# Patient Record
Sex: Male | Born: 1985 | Race: White | Hispanic: No | Marital: Married | Smoking: Current every day smoker
Health system: Southern US, Community
[De-identification: ages and names within clinical notes are randomized; demographics above are authoritative.]

## PROBLEM LIST (undated history)

## (undated) DIAGNOSIS — K219 Gastro-esophageal reflux disease without esophagitis: Secondary | ICD-10-CM

## (undated) DIAGNOSIS — I499 Cardiac arrhythmia, unspecified: Secondary | ICD-10-CM

## (undated) DIAGNOSIS — G4730 Sleep apnea, unspecified: Secondary | ICD-10-CM

---
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
## 2016-09-21 DIAGNOSIS — G4733 Obstructive sleep apnea (adult) (pediatric): Secondary | ICD-10-CM | POA: Diagnosis not present

## 2016-09-22 DIAGNOSIS — G4733 Obstructive sleep apnea (adult) (pediatric): Secondary | ICD-10-CM | POA: Diagnosis not present

## 2016-09-22 DIAGNOSIS — G4719 Other hypersomnia: Secondary | ICD-10-CM | POA: Diagnosis not present

## 2016-09-22 DIAGNOSIS — Z6841 Body Mass Index (BMI) 40.0 and over, adult: Secondary | ICD-10-CM | POA: Diagnosis not present

## 2016-09-22 DIAGNOSIS — Z9989 Dependence on other enabling machines and devices: Secondary | ICD-10-CM | POA: Diagnosis not present

## 2016-10-17 DIAGNOSIS — H5203 Hypermetropia, bilateral: Secondary | ICD-10-CM | POA: Diagnosis not present

## 2017-04-27 ENCOUNTER — Other Ambulatory Visit (HOSPITAL_COMMUNITY): Payer: Self-pay | Admitting: Neurological Surgery

## 2017-04-27 DIAGNOSIS — M5416 Radiculopathy, lumbar region: Secondary | ICD-10-CM

## 2017-05-03 ENCOUNTER — Ambulatory Visit (HOSPITAL_COMMUNITY)
Admission: RE | Admit: 2017-05-03 | Discharge: 2017-05-03 | Disposition: A | Discharge status: Home / Self Care | Payer: Self-pay | Source: Ambulatory Visit | Attending: Neurological Surgery | Admitting: Neurological Surgery

## 2017-05-03 DIAGNOSIS — M5416 Radiculopathy, lumbar region: Secondary | ICD-10-CM | POA: Insufficient documentation

## 2017-05-03 DIAGNOSIS — M48061 Spinal stenosis, lumbar region without neurogenic claudication: Secondary | ICD-10-CM | POA: Insufficient documentation

## 2017-05-03 DIAGNOSIS — M4807 Spinal stenosis, lumbosacral region: Secondary | ICD-10-CM | POA: Insufficient documentation

## 2017-05-25 ENCOUNTER — Other Ambulatory Visit: Payer: Self-pay | Admitting: Neurological Surgery

## 2017-05-31 ENCOUNTER — Encounter (HOSPITAL_COMMUNITY): Payer: Self-pay

## 2017-05-31 ENCOUNTER — Encounter (HOSPITAL_COMMUNITY)
Admission: RE | Admit: 2017-05-31 | Discharge: 2017-05-31 | Disposition: A | Discharge status: Home / Self Care | Payer: Self-pay | Source: Ambulatory Visit | Attending: Neurological Surgery | Admitting: Neurological Surgery

## 2017-05-31 DIAGNOSIS — Z01810 Encounter for preprocedural cardiovascular examination: Secondary | ICD-10-CM | POA: Insufficient documentation

## 2017-05-31 DIAGNOSIS — Z01812 Encounter for preprocedural laboratory examination: Secondary | ICD-10-CM | POA: Insufficient documentation

## 2017-05-31 DIAGNOSIS — I493 Ventricular premature depolarization: Secondary | ICD-10-CM | POA: Insufficient documentation

## 2017-05-31 HISTORY — DX: Cardiac arrhythmia, unspecified: I49.9

## 2017-05-31 HISTORY — DX: Sleep apnea, unspecified: G47.30

## 2017-05-31 HISTORY — DX: Gastro-esophageal reflux disease without esophagitis: K21.9

## 2017-05-31 LAB — SURGICAL PCR SCREEN
MRSA, PCR: NEGATIVE
Staphylococcus aureus: NEGATIVE

## 2017-05-31 LAB — CBC
HEMATOCRIT: 46.8 % (ref 39.0–52.0)
HEMOGLOBIN: 16 g/dL (ref 13.0–17.0)
MCH: 30.4 pg (ref 26.0–34.0)
MCHC: 34.2 g/dL (ref 30.0–36.0)
MCV: 88.8 fL (ref 78.0–100.0)
PLATELETS: 254 10*3/uL (ref 150–400)
RBC: 5.27 MIL/uL (ref 4.22–5.81)
RDW: 12.9 % (ref 11.5–15.5)
WBC: 10.3 10*3/uL (ref 4.0–10.5)

## 2017-05-31 NOTE — Pre-Procedure Instructions (Addendum)
Jesse Ibarra  05/31/2017      [facility] [address] [place] - [address] [place] [address] Phone: [phone] Fax: [phone]    Your procedure is scheduled on 06/01/17  [facility] Admitting at 1115 A.M.  Call this number if you have problems the morning of surgery:  [phone]   Remember:  Do not eat food or drink liquids after midnight.  Take these medicines the morning of surgery with A SIP OF WATER     Gabapentin, tramadol if needed  STOP all herbel meds, nsaids (aleve,naproxen,advil,ibuprofen) prior to surgery starting 05/31/17 including all vitamins/supplements,aspirin,fish oil   Do not wear jewelry, make-up or nail polish.  Do not wear lotions, powders, or perfumes, or deoderant.  Do not shave 48 hours prior to surgery.  Men may shave face and neck.  Do not bring valuables to the hospital.  [facility] is not responsible for any belongings or valuables.  Contacts, dentures or bridgework may not be worn into surgery.  Leave your suitcase in the car.  After surgery it may be brought to your room.  For patients admitted to the hospital, discharge time will be determined by your treatment team.  Patients discharged the day of surgery will not be allowed to drive home.   Special instructions:   Special Instructions: [place] - Preparing for Surgery  Before surgery, you can play an important role.  Because skin is not sterile, your skin needs to be as free of germs as possible.  You can reduce the number of germs on you skin by washing with CHG (chlorahexidine gluconate) soap before surgery.  CHG is an antiseptic cleaner which kills germs and bonds with the skin to continue killing germs even after washing.  Please DO NOT use if you have an allergy to CHG or antibacterial soaps.  If your skin becomes reddened/irritated stop using the CHG and inform your nurse when you arrive at [facility].  Do not shave (including legs and underarms) for at least 48 hours prior to the first CHG shower.  You may shave your face.  Please follow these instructions carefully:   1.  Shower with CHG Soap the night before surgery and the morning of Surgery.  2.  If you choose to wash your hair, wash your hair first as usual with your normal shampoo.  3.  After you shampoo, rinse your hair and body thoroughly to remove the Shampoo.  4.  Use CHG as you would any other liquid soap.  You can apply chg directly  to the skin and wash gently with scrungie or a clean washcloth.  5.  Apply the CHG Soap to your body ONLY FROM THE NECK DOWN.  Do not use on open wounds or open sores.  Avoid contact with your eyes ears, mouth and genitals (private parts).  Wash genitals (private parts)       with your normal soap.  6.  Wash thoroughly, paying special attention to the area where your surgery will be performed.  7.  Thoroughly rinse your body with warm water from the neck down.  8.  DO NOT shower/wash with your normal soap after using and rinsing off the CHG Soap.  9.  Pat yourself dry with a clean towel.            10.  Wear clean pajamas.            11.  Place clean sheets on your bed the night of your first shower and do not sleep with pets.  Day of Surgery  Do not apply any lotions/deodorants the morning of surgery.  Please wear clean clothes to the hospital/surgery center.  Please read over the  fact sheets that you were given.

## 2017-06-01 ENCOUNTER — Ambulatory Visit (HOSPITAL_COMMUNITY)
Admission: RE | Disposition: A | Discharge status: Home / Self Care | Payer: Self-pay | Source: Ambulatory Visit | Attending: Neurological Surgery

## 2017-06-01 ENCOUNTER — Ambulatory Visit (HOSPITAL_COMMUNITY)
Admission: RE | Admit: 2017-06-01 | Discharge: 2017-06-02 | Disposition: A | Discharge status: Home / Self Care | Payer: Self-pay | Source: Ambulatory Visit | Attending: Neurological Surgery | Admitting: Neurological Surgery

## 2017-06-01 ENCOUNTER — Ambulatory Visit (HOSPITAL_COMMUNITY): Payer: Self-pay | Admitting: Certified Registered"

## 2017-06-01 ENCOUNTER — Ambulatory Visit (HOSPITAL_COMMUNITY): Payer: Self-pay

## 2017-06-01 ENCOUNTER — Encounter (HOSPITAL_COMMUNITY): Payer: Self-pay | Admitting: *Deleted

## 2017-06-01 DIAGNOSIS — M4727 Other spondylosis with radiculopathy, lumbosacral region: Secondary | ICD-10-CM | POA: Diagnosis present

## 2017-06-01 DIAGNOSIS — Z419 Encounter for procedure for purposes other than remedying health state, unspecified: Secondary | ICD-10-CM

## 2017-06-01 DIAGNOSIS — F17210 Nicotine dependence, cigarettes, uncomplicated: Secondary | ICD-10-CM | POA: Insufficient documentation

## 2017-06-01 DIAGNOSIS — Z6841 Body Mass Index (BMI) 40.0 and over, adult: Secondary | ICD-10-CM | POA: Insufficient documentation

## 2017-06-01 DIAGNOSIS — Z9989 Dependence on other enabling machines and devices: Secondary | ICD-10-CM | POA: Insufficient documentation

## 2017-06-01 DIAGNOSIS — M5116 Intervertebral disc disorders with radiculopathy, lumbar region: Secondary | ICD-10-CM | POA: Insufficient documentation

## 2017-06-01 DIAGNOSIS — G4730 Sleep apnea, unspecified: Secondary | ICD-10-CM | POA: Insufficient documentation

## 2017-06-01 HISTORY — PX: LUMBAR LAMINECTOMY/DECOMPRESSION MICRODISCECTOMY: SHX5026

## 2017-06-01 SURGERY — LUMBAR LAMINECTOMY/DECOMPRESSION MICRODISCECTOMY 2 LEVELS
Anesthesia: General

## 2017-06-01 MED ORDER — BUPIVACAINE HCL (PF) 0.25 % IJ SOLN
INTRAMUSCULAR | Status: AC
  Filled 2017-06-01: qty 10

## 2017-06-01 MED ORDER — 0.9 % SODIUM CHLORIDE (POUR BTL) OPTIME
TOPICAL | Status: DC | PRN
  Administered 2017-06-01: 1000 mL

## 2017-06-01 MED ORDER — PROMETHAZINE HCL 25 MG/ML IJ SOLN: 6.2500 mg | INTRAMUSCULAR | Status: DC | PRN

## 2017-06-01 MED ORDER — DEXAMETHASONE SODIUM PHOSPHATE 10 MG/ML IJ SOLN
INTRAMUSCULAR | Status: AC
  Filled 2017-06-01: qty 1

## 2017-06-01 MED ORDER — SUGAMMADEX SODIUM 500 MG/5ML IV SOLN
INTRAVENOUS | Status: AC
  Filled 2017-06-01: qty 5

## 2017-06-01 MED ORDER — SODIUM CHLORIDE 0.9% FLUSH: 3.0000 mL | INTRAVENOUS | Status: DC | PRN

## 2017-06-01 MED ORDER — HYDROMORPHONE HCL 1 MG/ML IJ SOLN
0.2500 mg | INTRAMUSCULAR | Status: DC | PRN
  Administered 2017-06-01: 0.5 mg via INTRAVENOUS

## 2017-06-01 MED ORDER — GELATIN ABSORBABLE MT POWD
OROMUCOSAL | Status: DC | PRN
  Administered 2017-06-01 (×3): via TOPICAL

## 2017-06-01 MED ORDER — THROMBIN 5000 UNITS EX SOLR
CUTANEOUS | Status: AC
  Filled 2017-06-01: qty 5000

## 2017-06-01 MED ORDER — CHLORHEXIDINE GLUCONATE CLOTH 2 % EX PADS: 6.0000 | MEDICATED_PAD | Freq: Once | CUTANEOUS | Status: DC

## 2017-06-01 MED ORDER — FENTANYL CITRATE (PF) 100 MCG/2ML IJ SOLN
INTRAMUSCULAR | Status: DC | PRN
  Administered 2017-06-01: 250 ug via INTRAVENOUS
  Administered 2017-06-01 (×2): 50 ug via INTRAVENOUS

## 2017-06-01 MED ORDER — POTASSIUM CHLORIDE IN NACL 20-0.9 MEQ/L-% IV SOLN: 100.0000 mL/h | INTRAVENOUS | Status: DC

## 2017-06-01 MED ORDER — BISACODYL 10 MG RE SUPP: 10.0000 mg | Freq: Every day | RECTAL | Status: DC | PRN

## 2017-06-01 MED ORDER — ALBUMIN HUMAN 5 % IV SOLN
INTRAVENOUS | Status: DC | PRN
  Administered 2017-06-01 (×2): via INTRAVENOUS

## 2017-06-01 MED ORDER — THROMBIN 5000 UNITS EX SOLR
CUTANEOUS | Status: DC | PRN
  Administered 2017-06-01 (×2): 5000 [IU] via TOPICAL

## 2017-06-01 MED ORDER — DEXTROSE 5 % IV SOLN
3.0000 g | INTRAVENOUS | Status: AC
  Administered 2017-06-01: 3 g via INTRAVENOUS
  Filled 2017-06-01: qty 3000

## 2017-06-01 MED ORDER — METHYLPREDNISOLONE ACETATE 80 MG/ML IJ SUSP
INTRAMUSCULAR | Status: AC
  Filled 2017-06-01: qty 1

## 2017-06-01 MED ORDER — PROPOFOL 10 MG/ML IV BOLUS
INTRAVENOUS | Status: AC
  Filled 2017-06-01: qty 20

## 2017-06-01 MED ORDER — CEFAZOLIN SODIUM-DEXTROSE 2-4 GM/100ML-% IV SOLN: 2.0000 g | INTRAVENOUS | Status: DC

## 2017-06-01 MED ORDER — ALUM & MAG HYDROXIDE-SIMETH 200-200-20 MG/5ML PO SUSP: 30.0000 mL | Freq: Four times a day (QID) | ORAL | Status: DC | PRN

## 2017-06-01 MED ORDER — METHOCARBAMOL 750 MG PO TABS
750.0000 mg | ORAL_TABLET | Freq: Four times a day (QID) | ORAL | Status: DC
  Administered 2017-06-01: 750 mg via ORAL
  Filled 2017-06-01: qty 1

## 2017-06-01 MED ORDER — PHENOL 1.4 % MT LIQD: 1.0000 | OROMUCOSAL | Status: DC | PRN

## 2017-06-01 MED ORDER — OXYCODONE-ACETAMINOPHEN 7.5-325 MG PO TABS: 1.0000 | ORAL_TABLET | ORAL | 0 refills | Status: AC | PRN

## 2017-06-01 MED ORDER — MIDAZOLAM HCL 5 MG/5ML IJ SOLN
INTRAMUSCULAR | Status: DC | PRN
  Administered 2017-06-01: 2 mg via INTRAVENOUS

## 2017-06-01 MED ORDER — CELECOXIB 200 MG PO CAPS
200.0000 mg | ORAL_CAPSULE | Freq: Two times a day (BID) | ORAL | Status: DC
  Administered 2017-06-01: 200 mg via ORAL
  Filled 2017-06-01: qty 1

## 2017-06-01 MED ORDER — FLEET ENEMA 7-19 GM/118ML RE ENEM: 1.0000 | ENEMA | Freq: Once | RECTAL | Status: DC | PRN

## 2017-06-01 MED ORDER — METHYLPREDNISOLONE ACETATE 80 MG/ML IJ SUSP
INTRAMUSCULAR | Status: DC | PRN
  Administered 2017-06-01: 80 mg

## 2017-06-01 MED ORDER — ACETAMINOPHEN 500 MG PO TABS
1000.0000 mg | ORAL_TABLET | Freq: Four times a day (QID) | ORAL | Status: DC
  Administered 2017-06-01 – 2017-06-02 (×2): 1000 mg via ORAL
  Filled 2017-06-01 (×2): qty 2

## 2017-06-01 MED ORDER — SODIUM CHLORIDE 0.9 % IV SOLN: 250.0000 mL | INTRAVENOUS | Status: DC

## 2017-06-01 MED ORDER — BUPIVACAINE-EPINEPHRINE (PF) 0.5% -1:200000 IJ SOLN
INTRAMUSCULAR | Status: AC
  Filled 2017-06-01: qty 30

## 2017-06-01 MED ORDER — KETOROLAC TROMETHAMINE 30 MG/ML IJ SOLN
INTRAMUSCULAR | Status: DC | PRN
  Administered 2017-06-01: 30 mg via INTRA_ARTICULAR

## 2017-06-01 MED ORDER — OXYCODONE HCL 5 MG PO TABS
ORAL_TABLET | ORAL | Status: AC
  Filled 2017-06-01: qty 2

## 2017-06-01 MED ORDER — GABAPENTIN 600 MG PO TABS: 600.0000 mg | ORAL_TABLET | Freq: Three times a day (TID) | ORAL | Status: DC

## 2017-06-01 MED ORDER — DIAZEPAM 5 MG PO TABS
ORAL_TABLET | ORAL | Status: AC
  Filled 2017-06-01: qty 1

## 2017-06-01 MED ORDER — THROMBIN 5000 UNITS EX SOLR
CUTANEOUS | Status: AC
  Filled 2017-06-01: qty 15000

## 2017-06-01 MED ORDER — MENTHOL 3 MG MT LOZG: 1.0000 | LOZENGE | OROMUCOSAL | Status: DC | PRN

## 2017-06-01 MED ORDER — MIDAZOLAM HCL 2 MG/2ML IJ SOLN
INTRAMUSCULAR | Status: AC
  Filled 2017-06-01: qty 2

## 2017-06-01 MED ORDER — OXYCODONE HCL ER 20 MG PO T12A
20.0000 mg | EXTENDED_RELEASE_TABLET | Freq: Two times a day (BID) | ORAL | Status: DC
  Administered 2017-06-01: 20 mg via ORAL
  Filled 2017-06-01: qty 1

## 2017-06-01 MED ORDER — SUCCINYLCHOLINE CHLORIDE 20 MG/ML IJ SOLN
INTRAMUSCULAR | Status: DC | PRN
  Administered 2017-06-01: 160 mg via INTRAVENOUS

## 2017-06-01 MED ORDER — SODIUM CHLORIDE 0.9% FLUSH: 3.0000 mL | Freq: Two times a day (BID) | INTRAVENOUS | Status: DC

## 2017-06-01 MED ORDER — ALBUTEROL SULFATE HFA 108 (90 BASE) MCG/ACT IN AERS
INHALATION_SPRAY | RESPIRATORY_TRACT | Status: DC | PRN
Start: 2017-06-01 — End: 2017-06-01
  Administered 2017-06-01 (×5): 2 via RESPIRATORY_TRACT

## 2017-06-01 MED ORDER — CEFAZOLIN SODIUM-DEXTROSE 1-4 GM/50ML-% IV SOLN
1.0000 g | Freq: Three times a day (TID) | INTRAVENOUS | Status: AC
  Administered 2017-06-01 – 2017-06-02 (×2): 1 g via INTRAVENOUS
  Filled 2017-06-01 (×2): qty 50

## 2017-06-01 MED ORDER — ONDANSETRON HCL 4 MG/2ML IJ SOLN
4.0000 mg | Freq: Four times a day (QID) | INTRAMUSCULAR | Status: DC | PRN
  Administered 2017-06-01: 4 mg via INTRAVENOUS

## 2017-06-01 MED ORDER — PROPOFOL 10 MG/ML IV BOLUS
INTRAVENOUS | Status: DC | PRN
  Administered 2017-06-01: 200 mg via INTRAVENOUS

## 2017-06-01 MED ORDER — DEXAMETHASONE SODIUM PHOSPHATE 10 MG/ML IJ SOLN
INTRAMUSCULAR | Status: DC | PRN
  Administered 2017-06-01: 10 mg via INTRAVENOUS

## 2017-06-01 MED ORDER — LIDOCAINE-EPINEPHRINE 2 %-1:100000 IJ SOLN
INTRAMUSCULAR | Status: AC
  Filled 2017-06-01: qty 1

## 2017-06-01 MED ORDER — BUPIVACAINE-EPINEPHRINE (PF) 0.5% -1:200000 IJ SOLN
INTRAMUSCULAR | Status: DC | PRN
  Administered 2017-06-01: 20 mL via PERINEURAL

## 2017-06-01 MED ORDER — ROCURONIUM BROMIDE 100 MG/10ML IV SOLN
INTRAVENOUS | Status: DC | PRN
  Administered 2017-06-01: 20 mg via INTRAVENOUS

## 2017-06-01 MED ORDER — PANTOPRAZOLE SODIUM 40 MG IV SOLR: 40.0000 mg | Freq: Every day | INTRAVENOUS | Status: DC

## 2017-06-01 MED ORDER — OXYCODONE HCL 5 MG PO TABS
5.0000 mg | ORAL_TABLET | ORAL | Status: DC | PRN
Start: 2017-06-01 — End: 2017-06-02
  Administered 2017-06-01 – 2017-06-02 (×2): 10 mg via ORAL
  Filled 2017-06-01: qty 2

## 2017-06-01 MED ORDER — HYDROMORPHONE HCL 1 MG/ML IJ SOLN
INTRAMUSCULAR | Status: AC
  Filled 2017-06-01: qty 1

## 2017-06-01 MED ORDER — MIDAZOLAM HCL 2 MG/2ML IJ SOLN: 0.5000 mg | Freq: Once | INTRAMUSCULAR | Status: DC | PRN

## 2017-06-01 MED ORDER — FENTANYL CITRATE (PF) 250 MCG/5ML IJ SOLN
INTRAMUSCULAR | Status: AC
  Filled 2017-06-01: qty 5

## 2017-06-01 MED ORDER — DEXMEDETOMIDINE HCL 200 MCG/2ML IV SOLN
INTRAVENOUS | Status: DC | PRN
  Administered 2017-06-01: 12 ug via INTRAVENOUS
  Administered 2017-06-01 (×2): 8 ug via INTRAVENOUS
  Administered 2017-06-01: 12 ug via INTRAVENOUS

## 2017-06-01 MED ORDER — BUPIVACAINE HCL (PF) 0.25 % IJ SOLN
INTRAMUSCULAR | Status: DC | PRN
  Administered 2017-06-01: 30 mL

## 2017-06-01 MED ORDER — SUGAMMADEX SODIUM 500 MG/5ML IV SOLN
INTRAVENOUS | Status: DC | PRN
  Administered 2017-06-01: 300 mg via INTRAVENOUS

## 2017-06-01 MED ORDER — BUPIVACAINE HCL (PF) 0.25 % IJ SOLN
INTRAMUSCULAR | Status: AC
  Filled 2017-06-01: qty 30

## 2017-06-01 MED ORDER — ONDANSETRON HCL 4 MG/2ML IJ SOLN
INTRAMUSCULAR | Status: AC
  Filled 2017-06-01: qty 2

## 2017-06-01 MED ORDER — DOCUSATE SODIUM 100 MG PO CAPS
100.0000 mg | ORAL_CAPSULE | Freq: Two times a day (BID) | ORAL | Status: DC
  Administered 2017-06-01: 100 mg via ORAL
  Filled 2017-06-01: qty 1

## 2017-06-01 MED ORDER — KETOROLAC TROMETHAMINE 30 MG/ML IJ SOLN
INTRAMUSCULAR | Status: AC
  Filled 2017-06-01: qty 1

## 2017-06-01 MED ORDER — EPHEDRINE SULFATE 50 MG/ML IJ SOLN
INTRAMUSCULAR | Status: DC | PRN
  Administered 2017-06-01: 10 mg via INTRAVENOUS

## 2017-06-01 MED ORDER — ONDANSETRON HCL 4 MG PO TABS: 4.0000 mg | ORAL_TABLET | Freq: Four times a day (QID) | ORAL | Status: DC | PRN

## 2017-06-01 MED ORDER — PANTOPRAZOLE SODIUM 40 MG PO TBEC
40.0000 mg | DELAYED_RELEASE_TABLET | Freq: Every day | ORAL | Status: DC
  Administered 2017-06-01: 40 mg via ORAL
  Filled 2017-06-01: qty 1

## 2017-06-01 MED ORDER — SODIUM CHLORIDE 0.9 % IR SOLN
Status: DC | PRN
  Administered 2017-06-01: 14:00:00

## 2017-06-01 MED ORDER — MEPERIDINE HCL 25 MG/ML IJ SOLN: 6.2500 mg | INTRAMUSCULAR | Status: DC | PRN

## 2017-06-01 MED ORDER — LACTATED RINGERS IV SOLN
INTRAVENOUS | Status: DC
  Administered 2017-06-01 (×2): via INTRAVENOUS

## 2017-06-01 MED ORDER — LIDOCAINE 2% (20 MG/ML) 5 ML SYRINGE
INTRAMUSCULAR | Status: AC
  Filled 2017-06-01: qty 5

## 2017-06-01 MED ORDER — METHOCARBAMOL 750 MG PO TABS: 750.0000 mg | ORAL_TABLET | Freq: Three times a day (TID) | ORAL | 1 refills | Status: DC | PRN

## 2017-06-01 MED ORDER — DIAZEPAM 5 MG PO TABS
5.0000 mg | ORAL_TABLET | Freq: Four times a day (QID) | ORAL | Status: DC | PRN
  Administered 2017-06-01: 5 mg via ORAL

## 2017-06-01 MED ORDER — CEFAZOLIN SODIUM-DEXTROSE 2-4 GM/100ML-% IV SOLN
INTRAVENOUS | Status: AC
  Filled 2017-06-01: qty 100

## 2017-06-01 MED ORDER — LIDOCAINE-EPINEPHRINE 2 %-1:100000 IJ SOLN
INTRAMUSCULAR | Status: DC | PRN
  Administered 2017-06-01: 30 mL via INTRADERMAL

## 2017-06-01 MED ORDER — SENNA 8.6 MG PO TABS
1.0000 | ORAL_TABLET | Freq: Two times a day (BID) | ORAL | Status: DC
  Administered 2017-06-01: 8.6 mg via ORAL
  Filled 2017-06-01: qty 1

## 2017-06-01 MED ORDER — ONDANSETRON HCL 4 MG/2ML IJ SOLN
INTRAMUSCULAR | Status: DC | PRN
  Administered 2017-06-01: 4 mg via INTRAVENOUS

## 2017-06-01 SURGICAL SUPPLY — 75 items
ADH SKN CLS APL DERMABOND .7 (GAUZE/BANDAGES/DRESSINGS) ×1
APL SKNCLS STERI-STRIP NONHPOA (GAUZE/BANDAGES/DRESSINGS) ×1
BAG DECANTER FOR FLEXI CONT (MISCELLANEOUS) ×3 IMPLANT
BENZOIN TINCTURE PRP APPL 2/3 (GAUZE/BANDAGES/DRESSINGS) ×2 IMPLANT
BLADE CLIPPER SURG (BLADE) ×2 IMPLANT
BLADE SURG 11 STRL SS (BLADE) ×3 IMPLANT
BUR MATCHSTICK NEURO 3.0 LAGG (BURR) ×3 IMPLANT
BUR ROUND FLUTED 5 RND (BURR) ×2 IMPLANT
BUR ROUND FLUTED 5MM RND (BURR) ×1
CANISTER SUCT 3000ML PPV (MISCELLANEOUS) ×6 IMPLANT
CARTRIDGE OIL MAESTRO DRILL (MISCELLANEOUS) ×1 IMPLANT
CHLORAPREP W/TINT 26ML (MISCELLANEOUS) ×3 IMPLANT
CLOSURE WOUND 1/2 X4 (GAUZE/BANDAGES/DRESSINGS)
CONT SPEC 4OZ CLIKSEAL STRL BL (MISCELLANEOUS) ×3 IMPLANT
DECANTER SPIKE VIAL GLASS SM (MISCELLANEOUS) ×3 IMPLANT
DERMABOND ADVANCED (GAUZE/BANDAGES/DRESSINGS) ×2
DERMABOND ADVANCED .7 DNX12 (GAUZE/BANDAGES/DRESSINGS) ×1 IMPLANT
DIFFUSER DRILL AIR PNEUMATIC (MISCELLANEOUS) ×3 IMPLANT
DRAPE MICROSCOPE LEICA (MISCELLANEOUS) ×3 IMPLANT
DRAPE POUCH INSTRU U-SHP 10X18 (DRAPES) ×3 IMPLANT
DRAPE SURG 17X23 STRL (DRAPES) ×3 IMPLANT
DRSG OPSITE POSTOP 4X8 (GAUZE/BANDAGES/DRESSINGS) ×2 IMPLANT
ELECT BLADE 4.0 EZ CLEAN MEGAD (MISCELLANEOUS) ×3
ELECT COATED BLADE 2.86 ST (ELECTRODE) ×3 IMPLANT
ELECT REM PT RETURN 9FT ADLT (ELECTROSURGICAL) ×3
ELECTRODE BLDE 4.0 EZ CLN MEGD (MISCELLANEOUS) IMPLANT
ELECTRODE REM PT RTRN 9FT ADLT (ELECTROSURGICAL) ×1 IMPLANT
GAUZE SPONGE 4X4 12PLY STRL (GAUZE/BANDAGES/DRESSINGS) IMPLANT
GAUZE SPONGE 4X4 16PLY XRAY LF (GAUZE/BANDAGES/DRESSINGS) IMPLANT
GLOVE BIO SURGEON STRL SZ7.5 (GLOVE) ×2 IMPLANT
GLOVE BIOGEL PI IND STRL 6.5 (GLOVE) IMPLANT
GLOVE BIOGEL PI IND STRL 7.5 (GLOVE) ×2 IMPLANT
GLOVE BIOGEL PI INDICATOR 6.5 (GLOVE) ×2
GLOVE BIOGEL PI INDICATOR 7.5 (GLOVE) ×4
GLOVE SS BIOGEL STRL SZ 6.5 (GLOVE) IMPLANT
GLOVE SS BIOGEL STRL SZ 7.5 (GLOVE) ×2 IMPLANT
GLOVE SUPERSENSE BIOGEL SZ 6.5 (GLOVE) ×4
GLOVE SUPERSENSE BIOGEL SZ 7.5 (GLOVE) ×4
GOWN STRL REUS W/ TWL LRG LVL3 (GOWN DISPOSABLE) ×1 IMPLANT
GOWN STRL REUS W/ TWL XL LVL3 (GOWN DISPOSABLE) IMPLANT
GOWN STRL REUS W/TWL LRG LVL3 (GOWN DISPOSABLE) ×9
GOWN STRL REUS W/TWL XL LVL3 (GOWN DISPOSABLE)
HEMOSTAT POWDER KIT SURGIFOAM (HEMOSTASIS) ×7 IMPLANT
KIT BASIN OR (CUSTOM PROCEDURE TRAY) ×3 IMPLANT
KIT ROOM TURNOVER OR (KITS) ×3 IMPLANT
NDL HYPO 18GX1.5 BLUNT FILL (NEEDLE) ×1 IMPLANT
NDL HYPO 21X1.5 SAFETY (NEEDLE) ×2 IMPLANT
NDL SPNL 18GX3.5 QUINCKE PK (NEEDLE) IMPLANT
NEEDLE HYPO 18GX1.5 BLUNT FILL (NEEDLE) IMPLANT
NEEDLE HYPO 21X1.5 SAFETY (NEEDLE) ×6 IMPLANT
NEEDLE SPNL 18GX3.5 QUINCKE PK (NEEDLE) ×3 IMPLANT
NS IRRIG 1000ML POUR BTL (IV SOLUTION) ×3 IMPLANT
OIL CARTRIDGE MAESTRO DRILL (MISCELLANEOUS) ×3
PACK LAMINECTOMY NEURO (CUSTOM PROCEDURE TRAY) ×3 IMPLANT
PACK UNIVERSAL I (CUSTOM PROCEDURE TRAY) ×3 IMPLANT
PAD ARMBOARD 7.5X6 YLW CONV (MISCELLANEOUS) ×11 IMPLANT
PATTIES SURGICAL .5X1.5 (GAUZE/BANDAGES/DRESSINGS) ×3 IMPLANT
RUBBERBAND STERILE (MISCELLANEOUS) ×6 IMPLANT
SPONGE SURGIFOAM ABS GEL SZ50 (HEMOSTASIS) ×3 IMPLANT
STRIP CLOSURE SKIN 1/2X4 (GAUZE/BANDAGES/DRESSINGS) IMPLANT
SUT STRATAFIX MNCRL+ 3-0 PS-2 (SUTURE) ×1
SUT STRATAFIX MONOCRYL 3-0 (SUTURE) ×2
SUT VIC AB 0 CT1 18XCR BRD8 (SUTURE) ×2 IMPLANT
SUT VIC AB 0 CT1 8-18 (SUTURE) ×6
SUT VIC AB 2-0 CT1 18 (SUTURE) ×6 IMPLANT
SUT VIC AB 4-0 PS2 27 (SUTURE) ×2 IMPLANT
SUTURE STRATFX MNCRL+ 3-0 PS-2 (SUTURE) IMPLANT
SYR 30ML LL (SYRINGE) ×4 IMPLANT
SYR 3ML LL SCALE MARK (SYRINGE) ×2 IMPLANT
SYR 5ML LL (SYRINGE) ×3 IMPLANT
TOWEL GREEN STERILE (TOWEL DISPOSABLE) ×3 IMPLANT
TOWEL GREEN STERILE FF (TOWEL DISPOSABLE) ×3 IMPLANT
TUBE CONNECTING 12'X1/4 (SUCTIONS) ×1
TUBE CONNECTING 12X1/4 (SUCTIONS) ×2 IMPLANT
WATER STERILE IRR 1000ML POUR (IV SOLUTION) ×3 IMPLANT

## 2017-06-01 NOTE — Brief Op Note (Signed)
06/01/2017  3:26 PM  PATIENT:  Jesse Ibarra  31 y.o. male  PRE-OPERATIVE DIAGNOSIS:  Lumbosacral Herniated Nucleus Pulposus   POST-OPERATIVE DIAGNOSIS:  Lumbosacral Herniated Nucleus Pulposus   PROCEDURE:  Procedure(s) with comments: LUMBAR THREE-FOUR LUMBAR FOUR-FIVE LAMINECTOMIES AND DISCECTOMIES (N/A) - LUMBAR THREE-FOUR LUMBAR FOUR-FIVE LAMINECTOMIES AND DISCECTOMIES  SURGEON:  Surgeon(s) and Role:    * Ditty, Loura Halt, MD - Primary  PHYSICIAN ASSISTANT: Cindra Presume, PA-C  ANESTHESIA:   general  EBL:  Total I/O In: 1500 [I.V.:1000; IV Piggyback:500] Out: 1000 [Blood:1000]  BLOOD ADMINISTERED:none  DRAINS: none   LOCAL MEDICATIONS USED:  MARCAINE   , BUPIVICAINE  and LIDOCAINE   SPECIMEN:  No Specimen  DISPOSITION OF SPECIMEN:  N/A  COUNTS:  YES  TOURNIQUET:  * No tourniquets in log *  DICTATION: .Note written in EPIC  PLAN OF CARE: Discharge to home after PACU  PATIENT DISPOSITION:  PACU - hemodynamically stable.   Delay start of Pharmacological VTE agent (>24hrs) due to surgical blood loss or risk of bleeding: yes

## 2017-06-01 NOTE — Discharge Summary (Signed)
  Physician Discharge Summary  Patient ID: Jesse Ibarra MRN: [id number] DOB/AGE: 1986-01-02 31 y.o.  Admit date: 06/01/2017 Discharge date: 06/01/2017  Admission Diagnoses:  Lumbosacral spondylosis with radiculopathy  Discharge Diagnoses:  Same Active Problems:   Lumbosacral spondylosis with radiculopathy  Discharged Condition: Stable  Hospital Course:  Jesse Ibarra is a 31 y.o. male who was admitted for the below same-day procedure. There were no post operative complications. He was discharged per PACU.  Treatments: Surgery LUMBAR THREE-FOUR LUMBAR FOUR-FIVE LAMINECTOMIES AND DISCECTOMIES (N/A) - LUMBAR THREE-FOUR LUMBAR FOUR-FIVE LAMINECTOMIES AND DISCECTOMIES  Discharge Exam: Blood pressure 139/69, pulse 72, temperature 98.4 F (36.9 C), temperature source Oral, resp. rate 20, height  (1.88 m), weight (!) 146.6 kg (323 lb 4.8 oz), SpO2 96 %. Awake, alert, oriented Speech fluent, appropriate CN grossly intact MAEW with good strength Wound c/d/i  Disposition: Final discharge disposition not confirmed  Discharge Instructions    Call MD for:  difficulty breathing, headache or visual disturbances    Complete by:  As directed    Call MD for:  persistant dizziness or light-headedness    Complete by:  As directed    Call MD for:  redness, tenderness, or signs of infection (pain, swelling, redness, odor or green/yellow discharge around incision site)    Complete by:  As directed    Call MD for:  severe uncontrolled pain    Complete by:  As directed    Call MD for:  temperature >100.4    Complete by:  As directed    Diet general    Complete by:  As directed    Driving Restrictions    Complete by:  As directed    Do not drive until given clearance.   Increase activity slowly    Complete by:  As directed    Lifting restrictions    Complete by:  As directed    Do not lift anything >10lbs. Avoid bending and twisting in awkward positions. Avoid bending at  the back.   May shower / Bathe    Complete by:  As directed    In 24 hours. Okay to wash wound with warm soapy water. Avoid scrubbing the wound. Pat dry.   Remove dressing in 24 hours    Complete by:  As directed      Allergies as of 06/01/2017   No Known Allergies     Medication List    STOP taking these medications   traMADol 50 MG tablet Commonly known as:  ULTRAM     TAKE these medications   gabapentin 600 MG tablet Commonly known as:  NEURONTIN Take 600 mg by mouth 2 (two) times daily as needed (severe pain).   methocarbamol 750 MG tablet Commonly known as:  ROBAXIN-750 Take 1 tablet (750 mg total) by mouth 3 (three) times daily as needed for muscle spasms.   oxyCODONE-acetaminophen 7.5-325 MG tablet Commonly known as:  PERCOCET Take 1 tablet by mouth every 4 (four) hours as needed for severe pain.      Follow-up Information    Ditty, Loura Halt, MD. Schedule an appointment as soon as possible for a visit in 3 week(s).   Specialty:  Neurosurgery Contact information: [address] [place] [address] [phone] LOWENTHALSigned: Alyson Ingles 06/01/2017, 3:31 PM

## 2017-06-01 NOTE — Anesthesia Procedure Notes (Signed)
Procedure Name: Intubation Date/Time: 06/01/2017 1:15 PM Performed by: Rosiland Oz Pre-anesthesia Checklist: Patient identified, Emergency Drugs available, Suction available, Patient being monitored and Timeout performed Patient Re-evaluated:Patient Re-evaluated prior to induction Oxygen Delivery Method: Circle system utilized Preoxygenation: Pre-oxygenation with 100% oxygen Induction Type: IV induction, Cricoid Pressure applied and Rapid sequence Laryngoscope Size: Miller and 3 Grade View: Grade I Tube type: Oral Tube size: 7.5 mm Number of attempts: 1 Airway Equipment and Method: Stylet Placement Confirmation: ETT inserted through vocal cords under direct vision,  positive ETCO2 and breath sounds checked- equal and bilateral Secured at: 23 cm Tube secured with: Tape Dental Injury: Teeth and Oropharynx as per pre-operative assessment

## 2017-06-01 NOTE — H&P (Signed)
CC:  No chief complaint on file. Back and leg pain  HPI: Jesse Ibarra is a 31 y.o. male with bilateral leg pain.  He has had several months of conservative treatment with medications and rest and has not improved.  He presents for elective decompression and discectomy.  PMH: Past Medical History:  Diagnosis Date  . Dysrhythmia    pvc's occ infrequent now ?stress induced  . GERD (gastroesophageal reflux disease)    occ  . Sleep apnea    cpap    PSH: Past Surgical History:  Procedure Laterality Date  . SHOULDER ARTHROSCOPY Right 1997    SH: Social History  Substance Use Topics  . Smoking status: Current Every Day Smoker    Packs/day: 1.00    Years: 17.00  . Smokeless tobacco: Former Neurosurgeon    Types: Chew  . Alcohol use Yes     Comment: jack and coke occ    MEDS: Prior to Admission medications   Medication Sig Start Date End Date Taking? Authorizing Provider  gabapentin (NEURONTIN) 600 MG tablet Take 600 mg by mouth 2 (two) times daily as needed (severe pain).   Yes [provider]  traMADol (ULTRAM) 50 MG tablet Take 50 mg by mouth every 12 (twelve) hours as needed for moderate pain.   Yes [provider]    ALLERGY: No Known Allergies  ROS: ROS  NEUROLOGIC EXAM: Awake, alert, oriented Memory and concentration grossly intact Speech fluent, appropriate CN grossly intact Motor exam: Upper Extremities Deltoid Bicep Tricep Grip  Right 5/5 5/5 5/5 5/5  Left 5/5 5/5 5/5 5/5   Lower Extremity IP Quad PF DF EHL  Right 5/5 5/5 5/5 5/5 5/5  Left 5/5 5/5 5/5 5/5 5/5   Sensation grossly intact to LT  IMAGING: No new imaging  IMPRESSION: - 31 y.o. male with lumbosacral spondylosis with radiculopathy and a herniated nucleus pulposis.  He is neurologically intact.  PLAN: - L3-4, L4-5 laminectomy for decompression with discectomy. - We have reviewed the risks, benefits, and alternatives to surgery and he wishes to proceed.

## 2017-06-01 NOTE — Transfer of Care (Signed)
Immediate Anesthesia Transfer of Care Note  Patient: Jesse Ibarra  Procedure(s) Performed: LUMBAR THREE-FOUR LUMBAR FOUR-FIVE LAMINECTOMIES AND DISCECTOMIES (N/A )  Patient Location: PACU  Anesthesia Type:General  Level of Consciousness: awake and patient cooperative  Airway & Oxygen Therapy: Patient Spontanous Breathing and Patient connected to face mask oxygen  Post-op Assessment: Report given to RN and Post -op Vital signs reviewed and stable  Post vital signs: Reviewed and stable  Last Vitals:  Vitals:   06/01/17 1137 06/01/17 1540  BP: 139/69 (!) 129/59  Pulse: 72 96  Resp: 20 (!) 8  Temp: 36.9 C 36.4 C  SpO2: 96% 100%    Last Pain:  Vitals:   06/01/17 1540  TempSrc:   PainSc: Asleep         Complications: No apparent anesthesia complications

## 2017-06-01 NOTE — Anesthesia Preprocedure Evaluation (Signed)
Anesthesia Evaluation  Patient identified by MRN, date of birth, ID band Patient awake    Reviewed: Allergy & Precautions, NPO status , Patient's Chart, lab work & pertinent test results  History of Anesthesia Complications Negative for: history of anesthetic complications  Airway Mallampati: II  TM Distance: >3 FB Neck ROM: Full    Dental  (+) Chipped, Dental Advisory Given   Pulmonary sleep apnea and Continuous Positive Airway Pressure Ventilation , COPD, Current Smoker,    breath sounds clear to auscultation       Cardiovascular  Rhythm:Regular Rate:Normal     Neuro/Psych Back pain    GI/Hepatic Neg liver ROS, GERD  Poorly Controlled,  Endo/Other  Morbid obesity  Renal/GU negative Renal ROS     Musculoskeletal negative musculoskeletal ROS (+)   Abdominal (+) + obese,   Peds  Hematology negative hematology ROS (+)   Anesthesia Other Findings   Reproductive/Obstetrics                             Anesthesia Physical Anesthesia Plan  ASA: III  Anesthesia Plan: General   Post-op Pain Management:    Induction: Intravenous  PONV Risk Score and Plan: 2 and Ondansetron and Dexamethasone  Airway Management Planned: Oral ETT  Additional Equipment:   Intra-op Plan:   Post-operative Plan: Extubation in OR  Informed Consent: I have reviewed the patients History and Physical, chart, labs and discussed the procedure including the risks, benefits and alternatives for the proposed anesthesia with the patient or authorized representative who has indicated his/her understanding and acceptance.   Dental advisory given  Plan Discussed with: CRNA and Surgeon  Anesthesia Plan Comments: (Plan routine monitors, GETA)        Anesthesia Quick Evaluation

## 2017-06-01 NOTE — Anesthesia Postprocedure Evaluation (Signed)
Anesthesia Post Note  Patient: Jesse Ibarra  Procedure(s) Performed: LUMBAR THREE-FOUR LUMBAR FOUR-FIVE LAMINECTOMIES AND DISCECTOMIES (N/A )     Patient location during evaluation: PACU Anesthesia Type: General Level of consciousness: awake Pain management: pain level controlled Vital Signs Assessment: post-procedure vital signs reviewed and stable Respiratory status: spontaneous breathing Cardiovascular status: stable Anesthetic complications: no    Last Vitals:  Vitals:   06/01/17 1625 06/01/17 1640  BP: (!) 143/64 134/68  Pulse: 89 77  Resp: 14 18  Temp:    SpO2: 99% 97%    Last Pain:  Vitals:   06/01/17 1636  TempSrc:   PainSc: 6                  Morrisa Aldaba

## 2017-06-02 ENCOUNTER — Encounter (HOSPITAL_COMMUNITY): Payer: Self-pay | Admitting: Neurological Surgery

## 2017-06-02 MED FILL — Thrombin For Soln 5000 Unit: CUTANEOUS | Qty: 5000 | Status: AC

## 2017-06-02 MED FILL — Gelatin Absorbable MT Powder: OROMUCOSAL | Qty: 1 | Status: AC

## 2017-06-02 NOTE — Progress Notes (Signed)
Patient alert and oriented, mae's well, voiding adequate amount of urine, swallowing without difficulty, no c/o pain at time of discharge. Patient discharged home with family. Script and discharged instructions given to patient. Patient and family stated understanding of instructions given. Patient has an appointment with Dr. Ditty  

## 2017-06-05 NOTE — Op Note (Signed)
06/01/2017  11:58 AM  PATIENT:  Jesse Ibarra  31 y.o. male  PRE-OPERATIVE DIAGNOSIS:  Lumbar radiculopathy; lumbosacral spondylosis with radiculopathy; herniated nucleus pulposis L4-5  POST-OPERATIVE DIAGNOSIS:  Same  PROCEDURE:  L3-5 laminectomy for decompression; L4-5 discectomy; microsurgical technique requiring use of operating microscope  SURGEON:  Hulan Saas, MD  ASSISTANTS: Cindra Presume, PA-C  ANESTHESIA:   General  DRAINS: None   SPECIMEN:  None  INDICATION FOR PROCEDURE: 31 year old male with left greater than right lower extremity pain.  He has not responded to conservative treatment.  I recommended the above procedure.  The patient understood the risks, benefits, and alternatives and potential outcomes and wished to proceed.  PROCEDURE DETAILS: After smooth induction of general endotracheal anesthesia the patient was turned prone on the operating table on a Wilson frame. The skin of the lumbar region was clipped of hair. It was wiped down with alcohol. The patient was then prepped and draped in usual sterile fashion.   The subcutaneous tissues of the midline from approximately L3 to L5 was infiltrated with Marcaine. The skin in this area was opened sharply. The soft tissues were dissected with monopolar cautery. Subperiosteal dissection was carried out along the sides of the spinous processes and the laminar surfaces to the medial edge of the facet joints from L3 to L5. The spinous processes were removed with rongeurs. The laminae were then thinned with a high-speed bur. The remaining lamina was resected with a Kerrison punch. Thickened ligamentum flavum was separated from the dura and resected with a Kerrison rongeur. The thecal sac was further freed from the hypertrophied ligament in the lateral recesses.  Lateral recess decompression was completed. Decompression was carried out laterally to the foramina. The foramina were palpated and found to be without  residual stenosis.  The microscope was then draped and brought into the operative field to provide lighting and magnification.  Starting at the L4-5 disc space the dura was mobilized ventrally in both directions.  There was a large, thickly calcified caudally extruded disc herniation.  Starting from the left side I resected the "shell" of the calcified fragment with a Kerrison rongeur.  I then worked internally to remove the softer but still very fibrous disc material.  I then worked from the right side to remove the calcified shell and then the inner disc material.  I palpated the ventral surface of the thecal sac and did not appreciate any additional stenosis.  I mobilized the dura at L3-4 in a similar manner.  I inspected the disc and although there was a small disc bulge it was clearly not compressive.  I elected not to perform discectomy at this level.  I irrigated vigorously with bacitracin saline. There was excellent hemostasis.  The wound was then closed in routine anatomic layers with interrupted vicryl suture. I injected exparel in the paraspinous muscles. The skin was closed with a running Monocryl strata fix suture. It was then sealed with Dermabond. The patient was turned to the supine position and allowed to awaken from anesthesia.  PATIENT DISPOSITION:  PACU - hemodynamically stable.   Delay start of Pharmacological VTE agent (>24hrs) due to surgical blood loss or risk of bleeding:  yes

## 2018-02-03 ENCOUNTER — Encounter (HOSPITAL_COMMUNITY): Payer: Self-pay

## 2018-02-03 ENCOUNTER — Ambulatory Visit (HOSPITAL_COMMUNITY)
Admission: EM | Admit: 2018-02-03 | Discharge: 2018-02-03 | Disposition: A | Discharge status: Home / Self Care | Payer: BLUE CROSS/BLUE SHIELD | Attending: Family Medicine | Admitting: Family Medicine

## 2018-02-03 ENCOUNTER — Other Ambulatory Visit: Payer: Self-pay

## 2018-02-03 DIAGNOSIS — M546 Pain in thoracic spine: Secondary | ICD-10-CM

## 2018-02-03 DIAGNOSIS — S29012A Strain of muscle and tendon of back wall of thorax, initial encounter: Secondary | ICD-10-CM

## 2018-02-03 LAB — POCT URINALYSIS DIP (DEVICE)
Bilirubin Urine: NEGATIVE
Glucose, UA: NEGATIVE mg/dL
HGB URINE DIPSTICK: NEGATIVE
KETONES UR: NEGATIVE mg/dL
Leukocytes, UA: NEGATIVE
Nitrite: NEGATIVE
PH: 7 (ref 5.0–8.0)
PROTEIN: NEGATIVE mg/dL
SPECIFIC GRAVITY, URINE: 1.015 (ref 1.005–1.030)
Urobilinogen, UA: 0.2 mg/dL (ref 0.0–1.0)

## 2018-02-03 MED ORDER — METHOCARBAMOL 750 MG PO TABS: 750.0000 mg | ORAL_TABLET | Freq: Three times a day (TID) | ORAL | 0 refills | Status: DC | PRN

## 2018-02-03 MED ORDER — MELOXICAM 15 MG PO TABS: 15.0000 mg | ORAL_TABLET | Freq: Every day | ORAL | 0 refills | Status: AC

## 2018-02-03 MED ORDER — KETOROLAC TROMETHAMINE 60 MG/2ML IM SOLN
60.0000 mg | Freq: Once | INTRAMUSCULAR | Status: AC
Start: 2018-02-03 — End: 2018-02-03
  Administered 2018-02-03: 60 mg via INTRAMUSCULAR

## 2018-02-03 MED ORDER — KETOROLAC TROMETHAMINE 60 MG/2ML IM SOLN
INTRAMUSCULAR | Status: AC
  Filled 2018-02-03: qty 2

## 2018-02-03 NOTE — Discharge Instructions (Signed)
Mobic once a day, take with food, start tomorrow. Robaxin as needed, may be helpful at night. Can cause drowsiness so do not take if drinking, driving or operating machinery. Please follow up with your primary care provider and/or surgeon as may benefit more long term interventions.

## 2018-02-03 NOTE — ED Triage Notes (Signed)
Pt presents today with lower back pain that has been going on since the end of last year. States that it feels like it could be where his kidneys are located. Does admit he does not drink a lot of water.

## 2018-02-03 NOTE — ED Provider Notes (Signed)
[facility]    CSN: [id number] Arrival date & time: 02/03/18  1846     History   Chief Complaint Chief Complaint  Patient presents with  . Back Pain    HPI Jesse Ibarra is a 32 y.o. male.   Jesse Ibarra presents today with complaints of mid back pain. Started approximately 2 weeks ago after lifting a heavy door. Worse with some activity as well as worse if laying flat. No urinary or abdominal symptoms. Extensive back history with history of left sided sciatica as well as l4-5 surgery. Has intermittently taken ibuprofen for pain, none today. Pain is moderate in severity, improves if sitting upright. No numbness or tingling, weakness to legs. Baseline pain radiation down left leg. No urinary or stool incontinence, no blood in urine, no saddle paresthesia. Ambulatory without difficulty.    ROS per HPI.      Past Medical History:  Diagnosis Date  . Dysrhythmia    pvc's occ infrequent now ?stress induced  . GERD (gastroesophageal reflux disease)    occ  . Sleep apnea    cpap    Patient Active Problem List   Diagnosis Date Noted  . Lumbosacral spondylosis with radiculopathy 06/01/2017    Past Surgical History:  Procedure Laterality Date  . LUMBAR LAMINECTOMY/DECOMPRESSION MICRODISCECTOMY N/A 06/01/2017   Procedure: LUMBAR THREE-FOUR LUMBAR FOUR-FIVE LAMINECTOMIES AND DISCECTOMIES;  Surgeon: Ditty, Loura Halt, MD;  [facility] OR;  Service: Neurosurgery;  Laterality: N/A;  LUMBAR THREE-FOUR LUMBAR FOUR-FIVE LAMINECTOMIES AND DISCECTOMIES  . SHOULDER ARTHROSCOPY Right 1997       Home Medications    Prior to Admission medications   Medication Sig Start Date End Date Taking? Authorizing Provider  meloxicam (MOBIC) 15 MG tablet Take 1 tablet (15 mg total) by mouth daily. 02/03/18   Georgetta Haber, NP  methocarbamol (ROBAXIN-750) 750 MG tablet Take 1 tablet (750 mg total) by mouth 3 (three) times daily as needed for muscle spasms. May cause drowsiness  02/03/18   Linus Mako B, NP  oxyCODONE-acetaminophen (PERCOCET) 7.5-325 MG tablet Take 1 tablet by mouth every 4 (four) hours as needed for severe pain. 06/01/17   Costella, Darci Current, PA-C    Family History No family history on file.  Social History Social History   Tobacco Use  . Smoking status: Current Every Day Smoker    Packs/day: 1.00    Years: 17.00    Pack years: 17.00  . Smokeless tobacco: Former Neurosurgeon    Types: Chew  Substance Use Topics  . Alcohol use: Yes    Comment: jack and coke occ  . Drug use: No     Allergies   Patient has no known allergies.   Review of Systems Review of Systems   Physical Exam Triage Vital Signs ED Triage Vitals  Enc Vitals Group     BP 02/03/18 2006 139/70     Pulse Rate 02/03/18 2006 69     Resp 02/03/18 2006 16     Temp 02/03/18 2006 98.4 F (36.9 C)     Temp Source 02/03/18 2006 Oral     SpO2 02/03/18 2006 98 %     Weight --      Height --      Head Circumference --      Peak Flow --      Pain Score 02/03/18 2008 2     Pain Loc --      Pain Edu? --      Excl. in GC? --  No data found.  Updated Vital Signs BP 139/70 (BP Location: Right Arm)   Pulse 69   Temp 98.4 F (36.9 C) (Oral)   Resp 16   SpO2 98%    Physical Exam  Constitutional: He is oriented to person, place, and time. He appears well-developed and well-nourished.  Cardiovascular: Normal rate and regular rhythm.  Pulmonary/Chest: Effort normal and breath sounds normal.  Musculoskeletal:       Thoracic back: He exhibits decreased range of motion, tenderness and pain. He exhibits no bony tenderness, no swelling, no edema, no deformity, no laceration, no spasm and normal pulse.       Back:  Pain reproducible with palpation to mid back bilaterally; strength equal to bilateral lower extremities; sensation intact; increased pain with transition from sitting to laying and laying to sitting; increased pain with hip flexion while lying flat    Neurological: He is alert and oriented to person, place, and time.  Skin: Skin is warm and dry.     UC Treatments / Results  Labs (all labs ordered are listed, but only abnormal results are displayed) Labs Reviewed  POCT URINALYSIS DIP (DEVICE)    EKG None  Radiology No results found.  Procedures Procedures (including critical care time)  Medications Ordered in UC Medications  ketorolac (TORADOL) injection 60 mg (60 mg Intramuscular Given 02/03/18 2108)    Initial Impression / Assessment and Plan / UC Course  I have reviewed the triage vital signs and the nursing notes.  Pertinent labs & imaging results that were available during my care of the patient were reviewed by me and considered in my medical decision making (see chart for details).     Findings consistent with muscular strain. Muscle relaxer, nsaids, light and regular activity. Discussed may benefit from physical therapy, to continue to follow with PCP and/or surgeon. Return precautions provided. Patient verbalized understanding and agreeable to plan.  Ambulatory out of clinic without difficulty.    Final Clinical Impressions(s) / UC Diagnoses   Final diagnoses:  Strain of mid-back, initial encounter     Discharge Instructions     Mobic once a day, take with food, start tomorrow. Robaxin as needed, may be helpful at night. Can cause drowsiness so do not take if drinking, driving or operating machinery. Please follow up with your primary care provider and/or surgeon as may benefit more long term interventions.    ED Prescriptions    Medication Sig Dispense Auth. Provider   methocarbamol (ROBAXIN-750) 750 MG tablet Take 1 tablet (750 mg total) by mouth 3 (three) times daily as needed for muscle spasms. May cause drowsiness 20 tablet Linus MakoBurky, Leondra Cullin B, NP   meloxicam (MOBIC) 15 MG tablet Take 1 tablet (15 mg total) by mouth daily. 30 tablet Georgetta HaberBurky, Gottfried Standish B, NP     Controlled Substance Prescriptions [place]  Controlled Substance Registry consulted? Not Applicable   Georgetta HaberBurky, Kelsey Edman B, NP 02/03/18 2203

## 2019-08-12 IMAGING — CR DG LUMBAR SPINE 2-3V
2 series · 2 of 2 positions shown · non-contrast
Comparison: MRI May 03, 2017.

CLINICAL DATA: Surgical localization.

EXAM:
LUMBAR SPINE - 2-3 VIEW

[xtable lateral (1 of 2)]
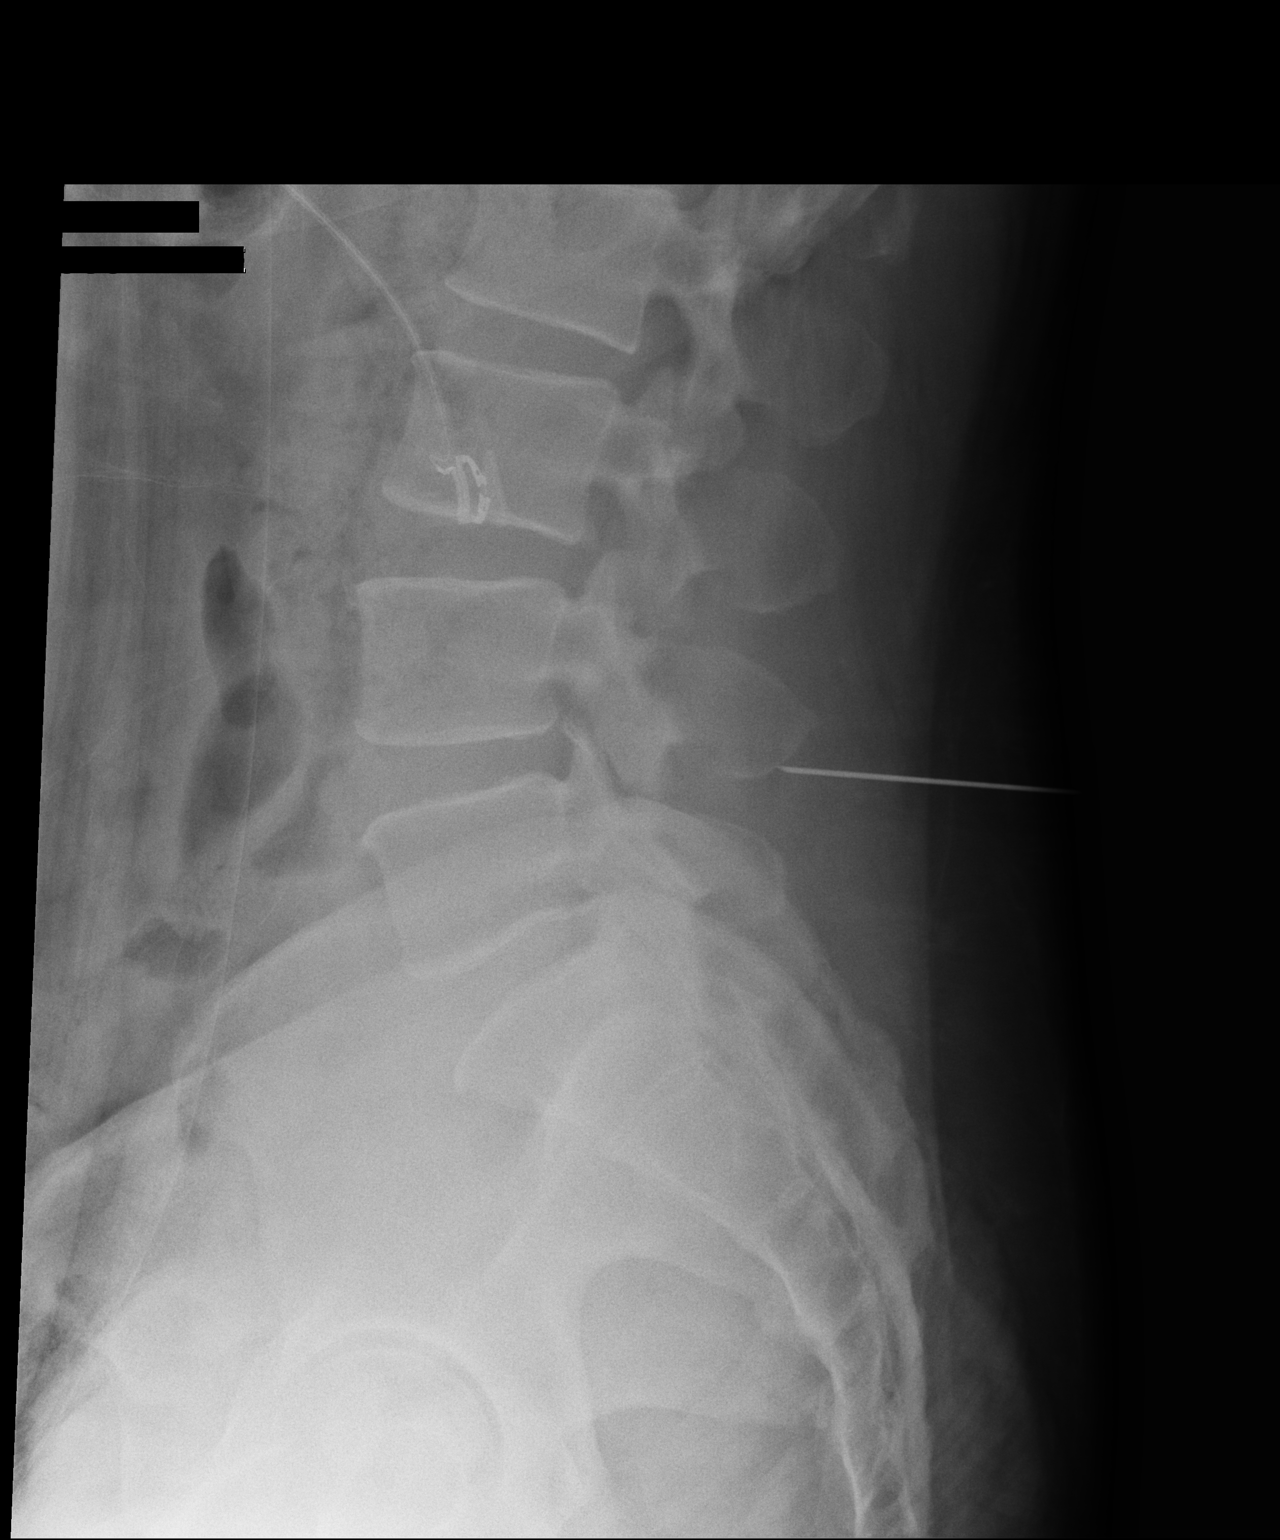

[xtable lateral (2 of 2)]
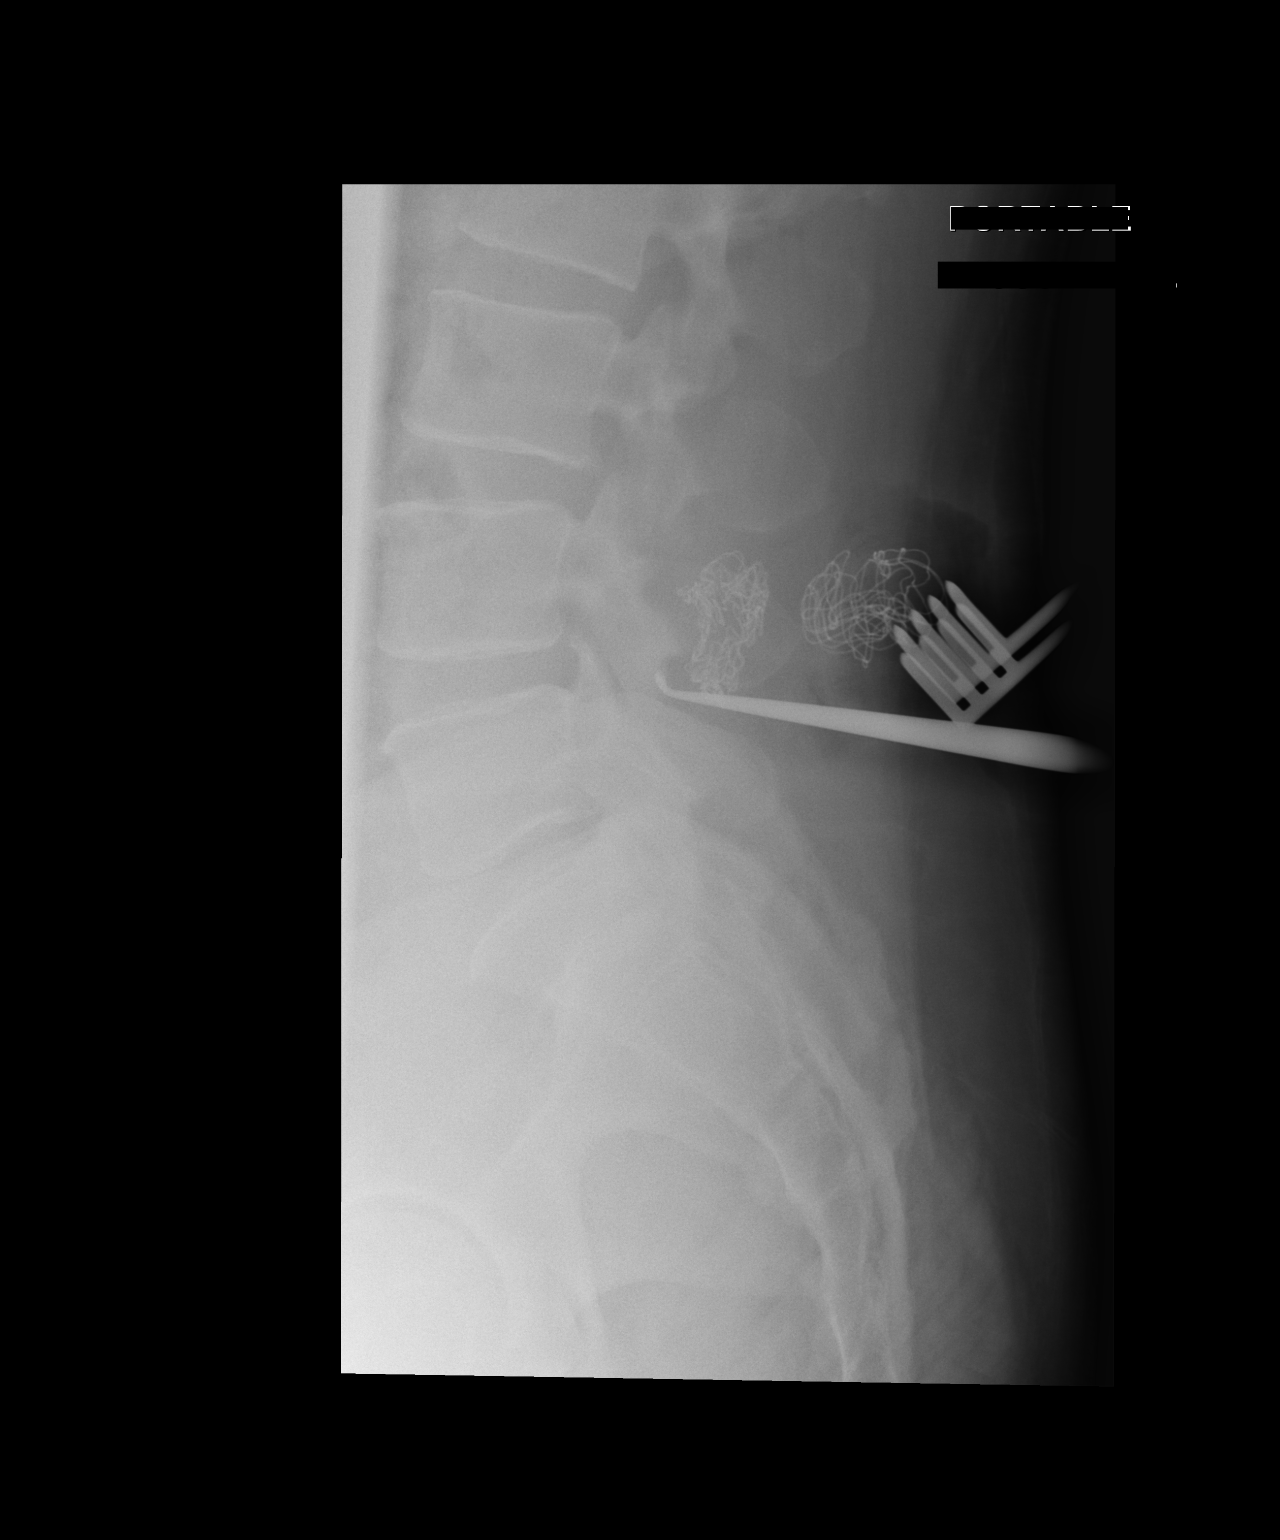

[2 of 2 positions shown; findings below may reference images not displayed]

FINDINGS: Two intraoperative cross-table lateral projections of the lumbar
spine were obtained. The first image demonstrates surgical probe
directed toward posterior spinous process of L4. The second image
demonstrates surgical probe directed at posterior margin of L4-5
disc space.
IMPRESSION: Surgical localization as described above.

## 2023-01-27 ENCOUNTER — Encounter (HOSPITAL_BASED_OUTPATIENT_CLINIC_OR_DEPARTMENT_OTHER): Payer: Self-pay | Admitting: Emergency Medicine

## 2023-01-27 ENCOUNTER — Emergency Department (HOSPITAL_BASED_OUTPATIENT_CLINIC_OR_DEPARTMENT_OTHER)
Admission: EM | Admit: 2023-01-27 | Discharge: 2023-01-28 | Disposition: A | Discharge status: Home / Self Care | Payer: BC Managed Care – PPO | Attending: Emergency Medicine | Admitting: Emergency Medicine

## 2023-01-27 ENCOUNTER — Other Ambulatory Visit: Payer: Self-pay

## 2023-01-27 DIAGNOSIS — M545 Low back pain, unspecified: Secondary | ICD-10-CM | POA: Insufficient documentation

## 2023-01-27 DIAGNOSIS — R109 Unspecified abdominal pain: Secondary | ICD-10-CM | POA: Diagnosis not present

## 2023-01-27 DIAGNOSIS — X501XXA Overexertion from prolonged static or awkward postures, initial encounter: Secondary | ICD-10-CM | POA: Diagnosis not present

## 2023-01-27 DIAGNOSIS — Y92007 Garden or yard of unspecified non-institutional (private) residence as the place of occurrence of the external cause: Secondary | ICD-10-CM | POA: Diagnosis not present

## 2023-01-27 DIAGNOSIS — Y9389 Activity, other specified: Secondary | ICD-10-CM | POA: Insufficient documentation

## 2023-01-27 LAB — URINALYSIS, ROUTINE W REFLEX MICROSCOPIC
Bilirubin Urine: NEGATIVE
Glucose, UA: NEGATIVE mg/dL
Hgb urine dipstick: NEGATIVE
Ketones, ur: NEGATIVE mg/dL
Leukocytes,Ua: NEGATIVE
Nitrite: NEGATIVE
Specific Gravity, Urine: 1.026 (ref 1.005–1.030)
pH: 6.5 (ref 5.0–8.0)

## 2023-01-27 NOTE — ED Triage Notes (Signed)
Pt c/o left sided flank pain ongoing for the past 2 weeks. Sts worsening pain today. Was seen at urgent care given tramadol without relief. No urinary s/s. Hx chronic lower back pain. No injury/trauma.

## 2023-01-28 ENCOUNTER — Emergency Department (HOSPITAL_BASED_OUTPATIENT_CLINIC_OR_DEPARTMENT_OTHER): Payer: BC Managed Care – PPO

## 2023-01-28 LAB — COMPREHENSIVE METABOLIC PANEL
ALT: 98 U/L — ABNORMAL HIGH (ref 0–44)
AST: 59 U/L — ABNORMAL HIGH (ref 15–41)
Albumin: 4.6 g/dL (ref 3.5–5.0)
Alkaline Phosphatase: 80 U/L (ref 38–126)
Anion gap: 12 (ref 5–15)
BUN: 13 mg/dL (ref 6–20)
CO2: 25 mmol/L (ref 22–32)
Calcium: 9.5 mg/dL (ref 8.9–10.3)
Chloride: 102 mmol/L (ref 98–111)
Creatinine, Ser: 0.8 mg/dL (ref 0.61–1.24)
GFR, Estimated: >60 mL/min (ref >60)
Glucose, Bld: 124 mg/dL — ABNORMAL HIGH (ref 70–99)
Potassium: 3.7 mmol/L (ref 3.5–5.1)
Sodium: 139 mmol/L (ref 135–145)
Total Bilirubin: 0.5 mg/dL (ref 0.3–1.2)
Total Protein: 7.4 g/dL (ref 6.5–8.1)

## 2023-01-28 LAB — CBC WITH DIFFERENTIAL/PLATELET
Abs Immature Granulocytes: 0.03 10*3/uL (ref 0.00–0.07)
Basophils Absolute: 0.1 10*3/uL (ref 0.0–0.1)
Basophils Relative: 1 %
Eosinophils Absolute: 0.3 10*3/uL (ref 0.0–0.5)
Eosinophils Relative: 3 %
HCT: 46.6 % (ref 39.0–52.0)
Hemoglobin: 16 g/dL (ref 13.0–17.0)
Immature Granulocytes: 0 %
Lymphocytes Relative: 26 %
Lymphs Abs: 2.6 10*3/uL (ref 0.7–4.0)
MCH: 30 pg (ref 26.0–34.0)
MCHC: 34.3 g/dL (ref 30.0–36.0)
MCV: 87.4 fL (ref 80.0–100.0)
Monocytes Absolute: 0.8 10*3/uL (ref 0.1–1.0)
Monocytes Relative: 7 %
Neutro Abs: 6.6 10*3/uL (ref 1.7–7.7)
Neutrophils Relative %: 63 %
Platelets: 278 10*3/uL (ref 150–400)
RBC: 5.33 MIL/uL (ref 4.22–5.81)
RDW: 12.8 % (ref 11.5–15.5)
WBC: 10.4 10*3/uL (ref 4.0–10.5)
nRBC: 0 % (ref 0.0–0.2)

## 2023-01-28 LAB — LIPASE, BLOOD: Lipase: <10 U/L — ABNORMAL LOW (ref 11–51)

## 2023-01-28 LAB — D-DIMER, QUANTITATIVE: D-Dimer, Quant: <0.27 ug/mL-FEU (ref 0.00–0.50)

## 2023-01-28 MED ORDER — METHOCARBAMOL 500 MG PO TABS
500.0000 mg | ORAL_TABLET | Freq: Once | ORAL | Status: AC
  Administered 2023-01-28: 500 mg via ORAL
  Filled 2023-01-28: qty 1

## 2023-01-28 MED ORDER — KETOROLAC TROMETHAMINE 30 MG/ML IJ SOLN
15.0000 mg | Freq: Once | INTRAMUSCULAR | Status: AC
  Administered 2023-01-28: 15 mg via INTRAMUSCULAR
  Filled 2023-01-28: qty 1

## 2023-01-28 MED ORDER — METHYLPREDNISOLONE 4 MG PO TBPK: ORAL_TABLET | ORAL | 0 refills | Status: AC

## 2023-01-28 MED ORDER — METHOCARBAMOL 500 MG PO TABS: 500.0000 mg | ORAL_TABLET | Freq: Three times a day (TID) | ORAL | 0 refills | Status: AC | PRN

## 2023-01-28 MED ORDER — HYDROMORPHONE HCL 1 MG/ML IJ SOLN
1.0000 mg | Freq: Once | INTRAMUSCULAR | Status: AC
  Administered 2023-01-28: 1 mg via INTRAMUSCULAR
  Filled 2023-01-28: qty 1

## 2023-01-28 NOTE — Discharge Instructions (Signed)
Take Medrol dose pack instead of the prednisone that you have at home.  Take Robaxin instead of tizanidine.  Follow-up with the neurosurgery office for repeat MRI.  Return to the ED sooner with worsening pain, weakness, numbness, tingling, bowel or bladder incontinence or any other concerns.

## 2023-01-28 NOTE — ED Provider Notes (Signed)
[place] EMERGENCY DEPARTMENT AT [facility] Provider Note   CSN: [id number] Arrival date & time: 01/27/23  2214     History  Chief Complaint  Patient presents with   Flank Pain    Jesse Ibarra is a 37 y.o. male.  Patient with a history of chronic low back pain and sleep apnea presenting with worsening of his low back pain for the past 2 weeks.  States he was working in the garden and felt something pop in his back and has had severe pain since that is higher location than his usual pain.  He also injured his back while working on his truck several days ago.  He was seen in urgent care prescribed tramadol as well as tizanidine but feels like this is making him "loopy".  He was given a steroid shot and a prednisone prescription but he did not take the prednisone.  Comes in today with worsening pain to his left low back with spasms.  This is higher than his usual back pain.  He did have surgery in 2018 for herniated disc by Dr. Bevely Palmer.  He has constant radiation of the pain down his leg which is unchanged with ongoing numbness and tingling.  No new weakness, numbness or tingling.  No bowel or bladder incontinence.  No fevers, chills, nausea or vomiting.  No chest pain or shortness of breath.  No history of IV drug abuse or cancer.  Endorses strong smelling urine but no blood in the urine.  The history is provided by the patient and the spouse.  Flank Pain Pertinent negatives include no chest pain, no abdominal pain, no headaches and no shortness of breath.       Home Medications Prior to Admission medications   Medication Sig Start Date End Date Taking? Authorizing Provider  meloxicam (MOBIC) 15 MG tablet Take 1 tablet (15 mg total) by mouth daily. 02/03/18   Georgetta Haber, NP  methocarbamol (ROBAXIN-750) 750 MG tablet Take 1 tablet (750 mg total) by mouth 3 (three) times daily as needed for muscle spasms. May cause drowsiness 02/03/18   Linus Mako B, NP   oxyCODONE-acetaminophen (PERCOCET) 7.5-325 MG tablet Take 1 tablet by mouth every 4 (four) hours as needed for severe pain. 06/01/17   Costella, Darci Current, PA-C      Allergies    Patient has no known allergies.    Review of Systems   Review of Systems  Constitutional:  Negative for activity change, appetite change and fever.  HENT:  Negative for congestion.   Respiratory:  Negative for cough, chest tightness and shortness of breath.   Cardiovascular:  Negative for chest pain.  Gastrointestinal:  Negative for abdominal pain, nausea and vomiting.  Genitourinary:  Positive for flank pain. Negative for dysuria, hematuria, testicular pain and urgency.  Musculoskeletal:  Positive for back pain.  Skin:  Negative for rash.  Neurological:  Negative for weakness and headaches.   all other systems are negative except as noted in the HPI and PMH.    Physical Exam Updated Vital Signs BP (!) 153/134   Pulse 71   Temp 98.1 F (36.7 C) (Oral)   Resp 15   Ht 6\' 2"  (1.88 m)   Wt (!) 145.2 kg   SpO2 98%   BMI 41.09 kg/m  Physical Exam Vitals and nursing note reviewed.  Constitutional:      General: He is not in acute distress.    Appearance: He is well-developed.  HENT:  Head: Normocephalic and atraumatic.     Mouth/Throat:     Pharynx: No oropharyngeal exudate.  Eyes:     Conjunctiva/sclera: Conjunctivae normal.     Pupils: Pupils are equal, round, and reactive to light.  Neck:     Comments: No meningismus. Cardiovascular:     Rate and Rhythm: Normal rate and regular rhythm.     Heart sounds: Normal heart sounds. No murmur heard. Pulmonary:     Effort: Pulmonary effort is normal. No respiratory distress.     Breath sounds: Normal breath sounds.  Abdominal:     Palpations: Abdomen is soft.     Tenderness: There is no abdominal tenderness. There is no guarding or rebound.  Musculoskeletal:        General: Tenderness present. Normal range of motion.     Cervical back: Normal  range of motion and neck supple.     Comments: Left paraspinal lumbar tenderness  5/5 strength in bilateral lower extremities. Ankle plantar and dorsiflexion intact. Great toe extension intact bilaterally. +2 DP and PT pulses. Unable to elicit patellar reflexes bilaterally. Normal gait.   Skin:    General: Skin is warm.  Neurological:     Mental Status: He is alert and oriented to person, place, and time.     Cranial Nerves: No cranial nerve deficit.     Motor: No abnormal muscle tone.     Coordination: Coordination normal.     Comments:  5/5 strength throughout. CN 2-12 intact.Equal grip strength.   Psychiatric:        Behavior: Behavior normal.     ED Results / Procedures / Treatments   Labs (all labs ordered are listed, but only abnormal results are displayed) Labs Reviewed  URINALYSIS, ROUTINE W REFLEX MICROSCOPIC - Abnormal; Notable for the following components:      Result Value   Protein, ur TRACE (*)    All other components within normal limits  COMPREHENSIVE METABOLIC PANEL - Abnormal; Notable for the following components:   Glucose, Bld 124 (*)    AST 59 (*)    ALT 98 (*)    All other components within normal limits  LIPASE, BLOOD - Abnormal; Notable for the following components:   Lipase <10 (*)    All other components within normal limits  CBC WITH DIFFERENTIAL/PLATELET  D-DIMER, QUANTITATIVE    EKG None  Radiology CT L-SPINE NO CHARGE  Result Date: 01/28/2023 CLINICAL DATA:  Low back pain EXAM: CT LUMBAR SPINE WITHOUT CONTRAST TECHNIQUE: Multidetector CT imaging of the lumbar spine was performed without intravenous contrast administration. Multiplanar CT image reconstructions were also generated. RADIATION DOSE REDUCTION: This exam was performed according to the departmental dose-optimization program which includes automated exposure control, adjustment of the mA and/or kV according to patient size and/or use of iterative reconstruction technique. COMPARISON:   None Available. FINDINGS: Segmentation: 5 lumbar type vertebrae. Alignment: There is 3 mm retrolisthesis L3-4, likely degenerative in nature. Otherwise normal lumbar lordosis. Vertebrae: No acute fracture or focal pathologic process. Bilateral laminectomy and resection of the L4 spinous process noted. Paraspinal and other soft tissues: Negative. Disc levels: There is endplate remodeling throughout the lumbar spine in keeping with changes of mild degenerative disc disease. Intervertebral disc heights are preserved. Axial images demonstrate: T12-L1: No significant canal stenosis. Mild right and moderate left neuroforaminal narrowing. Mild bilateral facet arthrosis. L1-2: Moderate bilateral facet arthrosis. Severe bilateral neuroforaminal narrowing, right greater than left. No significant canal stenosis. L2-3: Moderate right facet arthrosis. Mild left  facet arthrosis. Moderate right and severe left neuroforaminal narrowing. Mild central canal stenosis secondary to broad-based disc bulging in combination with facet arthrosis. L3-4: Moderate bilateral facet arthrosis. Severe bilateral neuroforaminal narrowing. Posterior disc osteophyte complex results in severe central canal stenosis within the sub foraminal zone. L4-5: Bilateral laminectomy and resection of the L4 spinous process noted. Moderate bilateral facet arthrosis. Severe bilateral neuroforaminal narrowing. L5-S1: Severe bilateral neuroforaminal narrowing. No significant canal stenosis. Moderate bilateral facet arthrosis. IMPRESSION: 1. No acute fracture or listhesis of the lumbar spine. 2. Multilevel degenerative disc and degenerative joint disease resulting in multilevel severe neuroforaminal narrowing and severe central canal stenosis at L3-4. Electronically Signed   By: Helyn Numbers M.D.   On: 01/28/2023 02:47   CT Renal Stone Study  Result Date: 01/28/2023 CLINICAL DATA:  Abdominal/flank pain, stone suspected, left flank pain EXAM: CT ABDOMEN AND PELVIS  WITHOUT CONTRAST TECHNIQUE: Multidetector CT imaging of the abdomen and pelvis was performed following the standard protocol without IV contrast. RADIATION DOSE REDUCTION: This exam was performed according to the departmental dose-optimization program which includes automated exposure control, adjustment of the mA and/or kV according to patient size and/or use of iterative reconstruction technique. COMPARISON:  None Available. FINDINGS: Lower chest: No acute abnormality. Hepatobiliary: Moderate hepatic steatosis. No definite focal intrahepatic mass. No intra or extrahepatic biliary ductal dilation. Gallbladder unremarkable. Pancreas: Unremarkable Spleen: Unremarkable Adrenals/Urinary Tract: Adrenal glands are unremarkable. Kidneys are normal, without renal calculi, focal lesion, or hydronephrosis. Bladder is unremarkable. Stomach/Bowel: Stomach is within normal limits. Appendix appears normal. No evidence of bowel wall thickening, distention, or inflammatory changes. Vascular/Lymphatic: No significant vascular findings are present. No enlarged abdominal or pelvic lymph nodes. Reproductive: Prostate is unremarkable. Other: No abdominal wall hernia or abnormality. No abdominopelvic ascites. Musculoskeletal: Degenerative changes are seen within the lumbar spine. L4 posterior decompression has been performed. No acute bone abnormality. No lytic or blastic bone lesion. IMPRESSION: 1. No acute intra-abdominal pathology identified. No definite radiographic explanation for the patient's reported symptoms. 2. Moderate hepatic steatosis. Electronically Signed   By: Helyn Numbers M.D.   On: 01/28/2023 02:37    Procedures Procedures    Medications Ordered in ED Medications  HYDROmorphone (DILAUDID) injection 1 mg (has no administration in time range)  ketorolac (TORADOL) 30 MG/ML injection 15 mg (has no administration in time range)  methocarbamol (ROBAXIN) tablet 500 mg (has no administration in time range)    ED  Course/ Medical Decision Making/ A&P                             Medical Decision Making Amount and/or Complexity of Data Reviewed Labs: ordered. Decision-making details documented in ED Course. Radiology: ordered and independent interpretation performed. Decision-making details documented in ED Course. ECG/medicine tests: ordered and independent interpretation performed. Decision-making details documented in ED Course.  Risk Prescription drug management.   Acute on chronic left-sided low back pain after injury.  Hypertensive on arrival.  Intact distal strength, sensation, pulses.  Low suspicion for cord compression or cauda equina. No CP or SOB.  Urinalysis is negative  Labs reassuring.  D-dimer is negative.  Low suspicion for PE or aortic dissection.  CT obtained to evaluate for kidney stone.  Also evaluate for aortic aneurysm. Blood pressure has normalized.  CT scan as above.  No evidence of kidney stone or aortic aneurysm.  Results reviewed and interpreted by me.  Does show foraminal stenosis at L3/L4 and severe central  canal stenosis.  Pain has improved.  Patient is able to ambulate.  Low suspicion for cord compression or cauda equina.  He has intact strength, sensation, pulses.  Reports no change in his chronic numbness and tingling to his left leg.  Discussed may benefit from repeat MRI but does not need emergent transfer tonight.  We will give course of steroids.  States he has prednisone at home but has not taken it.  Will change his muscle relaxer to methocarbamol which he is tolerated well in the past.  No evidence of kidney stone or other acute pathology on CT imaging.  Discussed steroids, muscle relaxers, follow-up with neurosurgery.  Return to the ED if worsening pain, weakness, numbness, bowel incontinence or any other concerns.        Final Clinical Impression(s) / ED Diagnoses Final diagnoses:  Acute left-sided low back pain without sciatica    Rx / DC  Orders ED Discharge Orders     None         Silvie Obremski, Jeannett Senior, MD 01/28/23 [phone]
